# Patient Record
Sex: Male | Born: 1985 | Race: White | Hispanic: No | Marital: Married | Smoking: Former smoker
Health system: Southern US, Community
[De-identification: ages and names within clinical notes are randomized; demographics above are authoritative.]

## PROBLEM LIST (undated history)

## (undated) DIAGNOSIS — N62 Hypertrophy of breast: Secondary | ICD-10-CM

## (undated) DIAGNOSIS — R413 Other amnesia: Secondary | ICD-10-CM

## (undated) DIAGNOSIS — M549 Dorsalgia, unspecified: Secondary | ICD-10-CM

## (undated) DIAGNOSIS — M546 Pain in thoracic spine: Secondary | ICD-10-CM

## (undated) DIAGNOSIS — E0590 Thyrotoxicosis, unspecified without thyrotoxic crisis or storm: Secondary | ICD-10-CM

## (undated) HISTORY — DX: Hypertrophy of breast: N62

## (undated) HISTORY — DX: Other amnesia: R41.3

## (undated) HISTORY — PX: APPENDECTOMY: SHX54

## (undated) HISTORY — PX: TONSILLECTOMY AND ADENOIDECTOMY: SUR1326

## (undated) HISTORY — DX: Pain in thoracic spine: M54.6

## (undated) HISTORY — DX: Dorsalgia, unspecified: M54.9

## (undated) HISTORY — DX: Thyrotoxicosis, unspecified without thyrotoxic crisis or storm: E05.90

---

## 2013-10-06 DIAGNOSIS — N62 Hypertrophy of breast: Secondary | ICD-10-CM | POA: Insufficient documentation

## 2013-10-06 DIAGNOSIS — E0590 Thyrotoxicosis, unspecified without thyrotoxic crisis or storm: Secondary | ICD-10-CM | POA: Insufficient documentation

## 2013-10-06 HISTORY — DX: Hypertrophy of breast: N62

## 2013-10-06 HISTORY — DX: Thyrotoxicosis, unspecified without thyrotoxic crisis or storm: E05.90

## 2013-10-20 DIAGNOSIS — M546 Pain in thoracic spine: Secondary | ICD-10-CM | POA: Insufficient documentation

## 2013-10-20 DIAGNOSIS — M549 Dorsalgia, unspecified: Secondary | ICD-10-CM

## 2013-10-20 HISTORY — DX: Dorsalgia, unspecified: M54.9

## 2013-10-20 HISTORY — DX: Pain in thoracic spine: M54.6

## 2013-10-27 ENCOUNTER — Ambulatory Visit: Payer: Self-pay | Admitting: Unknown Physician Specialty

## 2014-01-19 DIAGNOSIS — R413 Other amnesia: Secondary | ICD-10-CM | POA: Insufficient documentation

## 2014-01-19 HISTORY — DX: Other amnesia: R41.3

## 2015-02-12 ENCOUNTER — Encounter: Payer: Self-pay | Admitting: *Deleted

## 2015-02-13 ENCOUNTER — Encounter: Payer: Self-pay | Admitting: Obstetrics and Gynecology

## 2015-02-13 ENCOUNTER — Ambulatory Visit (INDEPENDENT_AMBULATORY_CARE_PROVIDER_SITE_OTHER): Payer: BLUE CROSS/BLUE SHIELD | Admitting: Obstetrics and Gynecology

## 2015-02-13 VITALS — BP 107/63 | HR 58 | Resp 16 | Ht 73.5 in | Wt 209.0 lb

## 2015-02-13 DIAGNOSIS — Z309 Encounter for contraceptive management, unspecified: Secondary | ICD-10-CM

## 2015-02-13 DIAGNOSIS — Z3009 Encounter for other general counseling and advice on contraception: Secondary | ICD-10-CM

## 2015-02-13 MED ORDER — DIAZEPAM 10 MG PO TABS: ORAL_TABLET | ORAL | Status: AC

## 2015-02-13 NOTE — Progress Notes (Signed)
02/13/2015 10:31 AM   Tyler Dunn 1986/01/19 [id number]  Referring provider: No referring provider defined for this encounter.  Chief Complaint  Patient presents with  . Vasectomy consult  . Establish Care    HPI: Patient is a 29 year old male presenting today for vasectomy consultation.  He is married with 2 biological children. Wife is currently pregnant with 3rd child. He and his wife are in agreement that they desire no further children.  No previous scrotal injuries, infections or surgeries.  He denies any coagulation or bleeding disorders.    PMH: Past Medical History  Diagnosis Date  . Hyperthyroidism 10/06/2013  . Acute thoracic back pain 10/20/2013  . Back ache 10/20/2013  . Breast development in males 10/06/2013  . Episodic memory loss 01/19/2014    Surgical History: Past Surgical History  Procedure Laterality Date  . Tonsillectomy and adenoidectomy    . Appendectomy      Home Medications:    Medication List       This list is accurate as of: 02/13/15 10:31 AM.  Always use your most recent med list.               diazepam 10 MG tablet  Commonly known as:  VALIUM  Take 1 tablet 45mins prior to procedure     methimazole 10 MG tablet  Commonly known as:  TAPAZOLE  TAKE 1 TABLET BY MOUTH EACH DAY        Allergies:  Allergies  Allergen Reactions  . Codeine     Other reaction(s): Unknown  . Penicillins     Other reaction(s): Unknown    Family History: Family History  Problem Relation Age of Onset  . Kidney disease Neg Hx   . Kidney cancer Neg Hx   . Prostate cancer Neg Hx     Social History:  reports that he has quit smoking. His smoking use included Cigarettes. His smokeless tobacco use includes Snuff. He reports that he drinks alcohol. He reports that he does not use illicit drugs.  ROS: UROLOGY Frequent Urination?: No Hard to postpone urination?: No Burning/pain with urination?: No Get up at night to urinate?: No Leakage of urine?:  No Urine stream starts and stops?: No Trouble starting stream?: No Do you have to strain to urinate?: No Blood in urine?: No Urinary tract infection?: No Sexually transmitted disease?: No Injury to kidneys or bladder?: No Painful intercourse?: No Weak stream?: No Erection problems?: No Penile pain?: No  Gastrointestinal Nausea?: No Vomiting?: No Indigestion/heartburn?: No Diarrhea?: No Constipation?: No  Constitutional Fever: No Night sweats?: No Weight loss?: No Fatigue?: No  Skin Skin rash/lesions?: No Itching?: No  Eyes Blurred vision?: No Double vision?: No  Ears/Nose/Throat Sore throat?: No Sinus problems?: No  Hematologic/Lymphatic Swollen glands?: No Easy bruising?: No  Cardiovascular Leg swelling?: No Chest pain?: No  Respiratory Cough?: No Shortness of breath?: No  Endocrine Excessive thirst?: No  Musculoskeletal Back pain?: No Joint pain?: No  Neurological Headaches?: No Dizziness?: No  Psychologic Depression?: No Anxiety?: No  Physical Exam: BP 107/63 mmHg  Pulse 58  Resp 16  Ht 6' 1.5" (1.867 m)  Wt 209 lb (94.802 kg)  BMI 27.20 kg/m2  Constitutional:  Alert and oriented, No acute distress. HEENT: [place] AT, moist mucus membranes.  Trachea midline, no masses. Cardiovascular: No clubbing, cyanosis, or edema. Respiratory: Normal respiratory effort, no increased work of breathing. GU: normal circumcised phallus, testicles descended bilaterally, normal epididymis, vas deferens normal and easily palpable bilaterally, no palpable masses  Skin: No rashes, bruises or suspicious lesions. Lymph: No cervical or inguinal adenopathy. Neurologic: Grossly intact, no focal deficits, moving all 4 extremities. Psychiatric: Normal mood and affect.  Assessment & Plan:    1. Vasectomy Consult-  Today, we discussed what the vas deferens is, where it is located, and its function. We reviewed the procedure for vasectomy, it's risks, benefits,  alternatives, and likelihood of achieving his goals. We discussed in detail the procedure, complications, and recovery as well as the need for clearance prior to unprotected intercourse. We discussed that vasectomy does not protect against sexually transmitted diseases. We discussed that this procedure does not result in immediate sterility and that they would need to use other forms of birth control until he has been cleared with negative postvasectomy semen analyses. I explained that the procedure is considered to be permanent and that attempts at reversal have varying degrees of success. These options include vasectomy reversal, sperm retrieval, and in vitro fertilization; these can be very expensive. We discussed the chance of postvasectomy pain syndrome which occurs in less than 5% of patients. I explained to the patient that there is no treatment to resolve this chronic pain, and that if it developed I would not be able to help resolve the issue, but that surgery is generally not needed for correction. I explained there have even been reports of systemic like illness associated with this chronic pain, and that there was no good cure. I explained that vasectomy it is not a 100% reliable form of birth control, and the risk of pregnancy after vasectomy is approximately 1 in 2000 men who had a negative postvasectomy semen analysis or rare non-motile sperm. I explained that repeat vasectomy was necessary in less than 1% of vasectomy procedures when employing the type of technique that I use. I explained that he should refrain from ejaculation for approximately one week following vasectomy. I explained that there are other options for birth control which are permanent and non-permanent; we discussed these. I explained the rates of surgical complications, such as symptomatic hematoma or infection, are low (1-2%) and vary with the surgeon's experience and criteria used to diagnose the complication.  The patient had  the opportunity to ask questions to his stated satisfaction. He voiced understanding of the above factors and stated that he has read all the information provided to him and the packets and informed consent  Patient's questions have been answered and he was given the pre-op vasectomy instruction sheet.  He is prescribed Valium 10 mg and instructed to take it 30 minutes prior to his vasectomy appointment.  He is to have a driver.  I reemphasized to the patient that this is to be considered a permanent form of birth control, that he is to use an alternative form of birth control until we receive the 3 months specimen and it is cleared of sperm and that this will not prevent STI's.  His questions are answered to his satisfaction and he understands the risks and is willing to proceed with the vasectomy.  He will schedule his vasectomy.    I spent 30 min with this patient of which greater than 50% was spent in counseling and coordination of care with the patient.   There are no diagnoses linked to this encounter.  Return for schedule vasectomy.  These notes generated with voice recognition software. I apologize for typographical errors.  Earlie Lou, FNP  [facility] [address], [address] [place], [place] [address] [phone]

## 2015-09-28 ENCOUNTER — Other Ambulatory Visit: Payer: Self-pay | Admitting: Family Medicine

## 2015-09-28 DIAGNOSIS — R1012 Left upper quadrant pain: Secondary | ICD-10-CM

## 2015-10-02 ENCOUNTER — Ambulatory Visit
Admission: RE | Admit: 2015-10-02 | Discharge: 2015-10-02 | Disposition: A | Discharge status: Home / Self Care | Payer: BLUE CROSS/BLUE SHIELD | Source: Ambulatory Visit | Attending: Family Medicine | Admitting: Family Medicine

## 2015-10-02 DIAGNOSIS — R1012 Left upper quadrant pain: Secondary | ICD-10-CM | POA: Diagnosis not present

## 2019-10-04 ENCOUNTER — Other Ambulatory Visit: Payer: Self-pay | Admitting: Physician Assistant

## 2019-10-04 DIAGNOSIS — IMO0001 Reserved for inherently not codable concepts without codable children: Secondary | ICD-10-CM

## 2019-10-17 ENCOUNTER — Other Ambulatory Visit: Payer: Self-pay

## 2019-10-17 ENCOUNTER — Ambulatory Visit
Admission: RE | Admit: 2019-10-17 | Discharge: 2019-10-17 | Disposition: A | Discharge status: Home / Self Care | Payer: 59 | Source: Ambulatory Visit | Attending: Physician Assistant | Admitting: Physician Assistant

## 2019-10-17 DIAGNOSIS — H9042 Sensorineural hearing loss, unilateral, left ear, with unrestricted hearing on the contralateral side: Secondary | ICD-10-CM | POA: Diagnosis present

## 2019-10-17 DIAGNOSIS — IMO0001 Reserved for inherently not codable concepts without codable children: Secondary | ICD-10-CM

## 2019-10-17 MED ORDER — GADOBUTROL 1 MMOL/ML IV SOLN
9.0000 mL | Freq: Once | INTRAVENOUS | Status: AC | PRN
  Administered 2019-10-17: 9 mL via INTRAVENOUS

## 2019-11-13 ENCOUNTER — Emergency Department: Payer: 59

## 2019-11-13 ENCOUNTER — Emergency Department
Admission: EM | Admit: 2019-11-13 | Discharge: 2019-11-13 | Disposition: A | Discharge status: Home / Self Care | Payer: 59 | Attending: Emergency Medicine | Admitting: Emergency Medicine

## 2019-11-13 DIAGNOSIS — F17228 Nicotine dependence, chewing tobacco, with other nicotine-induced disorders: Secondary | ICD-10-CM | POA: Diagnosis not present

## 2019-11-13 DIAGNOSIS — M542 Cervicalgia: Secondary | ICD-10-CM | POA: Diagnosis present

## 2019-11-13 DIAGNOSIS — Z880 Allergy status to penicillin: Secondary | ICD-10-CM | POA: Insufficient documentation

## 2019-11-13 DIAGNOSIS — M436 Torticollis: Secondary | ICD-10-CM | POA: Insufficient documentation

## 2019-11-13 MED ORDER — HYDROCODONE-ACETAMINOPHEN 5-325 MG PO TABS: 1.0000 | ORAL_TABLET | Freq: Four times a day (QID) | ORAL | 0 refills | Status: AC | PRN

## 2019-11-13 MED ORDER — METHOCARBAMOL 500 MG PO TABS: ORAL_TABLET | ORAL | 0 refills | Status: AC

## 2019-11-13 MED ORDER — NAPROXEN 500 MG PO TABS
500.0000 mg | ORAL_TABLET | Freq: Two times a day (BID) | ORAL | 0 refills | Status: AC
Start: 2019-11-13 — End: ?

## 2019-11-13 MED ORDER — KETOROLAC TROMETHAMINE 30 MG/ML IJ SOLN
30.0000 mg | Freq: Once | INTRAMUSCULAR | Status: DC
  Filled 2019-11-13: qty 1

## 2019-11-13 NOTE — Discharge Instructions (Addendum)
Follow-up with your primary care provider if any continued problems or not improving.  Begin taking medication that was sent to your pharmacy.  The hydrocodone is a narcotic and you have taken it in the past.  The methocarbamol is a muscle relaxant you may take 1 or 2 every 6 hours as needed for muscle spasms.  The naproxen is 500 mg twice a day with food.  Do not take the hydrocodone and methocarbamol if you plan on driving as it could cause drowsiness and increase your risk for injury.  Ice or heat to your muscles as needed for discomfort.  Try one and then try the other to see which one helps your muscles the best.  You should not plan on working if you are needing to take the muscle relaxant and pain medication.

## 2019-11-13 NOTE — ED Triage Notes (Signed)
Patient states he was asleep and felt a pop in his neck. His ROM of motion is now limited and to look down causes pain to go down into his back. Patient denies injury, and states this has not happened before.

## 2019-11-13 NOTE — ED Provider Notes (Signed)
[facility] Emergency Department Provider Note   ____________________________________________   None    (approximate)  I have reviewed the triage vital signs and the nursing notes.   HISTORY  Chief Complaint Neck Pain    HPI Tyler Dunn is a 34 y.o. male presents to the ED with complaint of neck pain.  Patient states that he was asleep and felt a pop in his neck that has limited his range of motion.  There was no accident or injury yesterday.  Patient states that [place].  He states this has never happened before.  He denies any muscle weakness or difficulty walking.  Currently he rates his pain as a 3 out of 10.     Past Medical History:  Diagnosis Date   Acute thoracic back pain 10/20/2013   Back ache 10/20/2013   Breast development in males 10/06/2013   Episodic memory loss 01/19/2014   Hyperthyroidism 10/06/2013    Patient Active Problem List   Diagnosis Date Noted   Episodic memory loss 01/19/2014   Acute thoracic back pain 10/20/2013   Back ache 10/20/2013   Breast development in males 10/06/2013   Hyperthyroidism 10/06/2013    Past Surgical History:  Procedure Laterality Date   APPENDECTOMY     TONSILLECTOMY AND ADENOIDECTOMY      Prior to Admission medications   Medication Sig Start Date End Date Taking? Authorizing Provider  diazepam (VALIUM) 10 MG tablet Take 1 tablet prior to procedure 02/13/15   Fernanda Drum, FNP  HYDROcodone-acetaminophen (NORCO/VICODIN) 5-325 MG tablet Take 1 tablet by mouth every 6 (six) hours as needed for moderate pain. 11/13/19   Tommi Rumps, PA-C  methimazole (TAPAZOLE) 10 MG tablet TAKE 1 TABLET BY MOUTH EACH DAY 02/03/15   [provider]  methocarbamol (ROBAXIN) 500 MG tablet 1-2 tablets every 6 hours prn muscle spasms 11/13/19   Tommi Rumps, PA-C  naproxen (NAPROSYN) 500 MG tablet Take 1 tablet (500 mg total) by mouth 2 (two) times  daily with a meal. 11/13/19   Tommi Rumps, PA-C    Allergies Codeine and Penicillins  Family History  Problem Relation Age of Onset   Kidney disease Neg Hx    Kidney cancer Neg Hx    Prostate cancer Neg Hx     Social History Social History   Tobacco Use   Smoking status: Former Smoker    Types: Cigarettes   Smokeless tobacco: Current User    Types: Snuff  Substance Use Topics   Alcohol use: Yes    Alcohol/week: 0.0 standard drinks   Drug use: No    Review of Systems Constitutional: No fever/chills. Eyes: No visual changes. ENT: No sore throat. Cardiovascular: Denies chest pain. Respiratory: Denies shortness of breath.  Negative for cough. Gastrointestinal: No abdominal pain.  No nausea, no vomiting.  Negative for diarrhea. Musculoskeletal: Negative for back pain.  Positive for cervical pain. Skin: Negative for rash. Neurological: Negative for headaches, focal weakness or numbness. ____________________________________________   PHYSICAL EXAM:  VITAL SIGNS: ED Triage Vitals  Enc Vitals Group     BP 11/13/19 0526 123/79     Pulse Rate 11/13/19 0526 62     Resp 11/13/19 0526 18     Temp 11/13/19 0526 98.2 F (36.8 C)     Temp Source 11/13/19 0526 Oral     SpO2 11/13/19 0526 100 %     Weight 11/13/19 0528 (!) 220 lb (99.8  kg)     Height 11/13/19 0528 6\' 1"  (1.854 m)     Head Circumference --      Peak Flow --      Pain Score 11/13/19 0527 3     Pain Loc --      Pain Edu? --      Excl. in GC? --    Constitutional: Alert and oriented. Well appearing and in no acute distress. Eyes: Conjunctivae are normal. PERRL. EOMI. Head: Atraumatic. Nose: No congestion/rhinnorhea. Neck: No stridor.  No point tenderness on palpation of cervical spine posteriorly.  Range of motion is restricted with lateral movement with the left being more restricted than the right.  Patient is able to extend but is limited with flexion.  No soft tissue injury or abrasions are  noted. Cardiovascular: Normal rate, regular rhythm. Grossly normal heart sounds.  Good peripheral circulation. Respiratory: Normal respiratory effort.  No retractions. Lungs CTAB. Gastrointestinal: Soft and nontender. No distention. Musculoskeletal: Moves upper and lower extremities without any difficulty.  Patient able to stand from a sitting position without any assistance.  Normal gait was noted.  Patient is able to form a complete fist bilaterally without any difficulty.  No isolated tenderness is noted to the trapezius or rhomboid muscles. Neurologic:  Normal speech and language. No gross focal neurologic deficits are appreciated. No gait instability. Skin:  Skin is warm, dry and intact. No rash noted. Psychiatric: Mood and affect are normal. Speech and behavior are normal.  ____________________________________________   LABS (all labs ordered are listed, but only abnormal results are displayed)  Labs Reviewed - No data to display  RADIOLOGY   Official radiology report(s): DG Cervical Spine Complete  Result Date: 11/13/2019 CLINICAL DATA:  Felt pop in neck.  Now with pain. EXAM: CERVICAL SPINE - COMPLETE 4+ VIEW COMPARISON:  None. FINDINGS: There is no evidence of cervical spine fracture or prevertebral soft tissue swelling. Alignment is normal. No other significant bone abnormalities are identified. IMPRESSION: Negative cervical spine radiographs. Electronically Signed   By: 11/15/2019 M.D.   On: 11/13/2019 06:17    ____________________________________________   PROCEDURES  Procedure(s) performed (including Critical Care):  Procedures   ____________________________________________   INITIAL IMPRESSION / ASSESSMENT AND PLAN / ED COURSE  As part of my medical decision making, I reviewed the following data within the electronic MEDICAL RECORD NUMBER Notes from prior ED visits and [place] Controlled Substance Database  Tyler Dunn was evaluated in Emergency Department on  11/13/2019 for the symptoms described in the history of present illness. He was evaluated in the context of the global COVID-19 pandemic, which necessitated consideration that the patient might be at risk for infection with the SARS-CoV-2 virus that causes COVID-19. Institutional protocols and algorithms that pertain to the evaluation of patients at risk for COVID-19 are in a state of rapid change based on information released by regulatory bodies including the CDC and federal and state organizations. These policies and algorithms were followed during the patient's care in the ED.  34 year old male presents to the ED with sudden onset of decreased range of motion and a pop to his neck while sleeping last night.  Patient denies any injury in the last several days that could cause this.  Range of motion laterally and also with flexion increases his cervical pain.  Remaining physical was benign.  Cervical x-rays were negative.  Physical exam and history are consistent with an acute torticollis.  Patient drove himself to the ED and although he  was reassured that the Toradol injection would not cause any drowsiness he refused stating that he would take the prescription medication when he got home.  He is encouraged to ice or apply heat to his muscles and begin medication.  A prescription for methocarbamol, naproxen and hydrocodone was sent to his pharmacy.  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  He is aware that the 2 medications could cause drowsiness and increase his risk for falling.  ____________________________________________   FINAL CLINICAL IMPRESSION(S) / ED DIAGNOSES  Final diagnoses:  Torticollis, acute     ED Discharge Orders         Ordered    methocarbamol (ROBAXIN) 500 MG tablet     Discontinue  Reprint     11/13/19 0745    naproxen (NAPROSYN) 500 MG tablet  2 times daily with meals     Discontinue  Reprint     11/13/19 0745     HYDROcodone-acetaminophen (NORCO/VICODIN) 5-325 MG tablet  Every 6 hours PRN     Discontinue  Reprint     11/13/19 0745           Note:  This document was prepared using Dragon voice recognition software and may include unintentional dictation errors.    Tommi Rumps, PA-C 11/13/19 [id number]    Jene Every, MD 11/13/19 [id number]

## 2020-06-01 ENCOUNTER — Other Ambulatory Visit: Payer: Self-pay | Admitting: Family Medicine

## 2020-06-11 ENCOUNTER — Other Ambulatory Visit: Payer: Self-pay | Admitting: Family Medicine

## 2020-06-11 DIAGNOSIS — U099 Post covid-19 condition, unspecified: Secondary | ICD-10-CM

## 2020-06-18 ENCOUNTER — Ambulatory Visit
Admission: RE | Admit: 2020-06-18 | Discharge: 2020-06-18 | Disposition: A | Discharge status: Home / Self Care | Payer: 59 | Source: Ambulatory Visit | Attending: Family Medicine | Admitting: Family Medicine

## 2020-06-18 ENCOUNTER — Other Ambulatory Visit: Payer: Self-pay

## 2020-06-18 DIAGNOSIS — U099 Post covid-19 condition, unspecified: Secondary | ICD-10-CM | POA: Diagnosis present

## 2020-06-18 MED ORDER — IOHEXOL 300 MG/ML  SOLN
75.0000 mL | Freq: Once | INTRAMUSCULAR | Status: AC | PRN
  Administered 2020-06-18: 75 mL via INTRAVENOUS

## 2020-06-21 ENCOUNTER — Other Ambulatory Visit: Payer: Self-pay | Admitting: Family Medicine

## 2020-06-21 ENCOUNTER — Other Ambulatory Visit (HOSPITAL_COMMUNITY): Payer: Self-pay | Admitting: Family Medicine

## 2020-06-21 DIAGNOSIS — R1011 Right upper quadrant pain: Secondary | ICD-10-CM

## 2020-07-02 ENCOUNTER — Other Ambulatory Visit: Payer: Self-pay

## 2020-07-02 ENCOUNTER — Ambulatory Visit
Admission: RE | Admit: 2020-07-02 | Discharge: 2020-07-02 | Disposition: A | Discharge status: Home / Self Care | Payer: 59 | Source: Ambulatory Visit | Attending: Family Medicine | Admitting: Family Medicine

## 2020-07-02 DIAGNOSIS — R1011 Right upper quadrant pain: Secondary | ICD-10-CM | POA: Diagnosis not present

## 2021-09-12 ENCOUNTER — Other Ambulatory Visit: Payer: Self-pay | Admitting: Family Medicine

## 2021-09-12 DIAGNOSIS — M542 Cervicalgia: Secondary | ICD-10-CM

## 2021-09-17 ENCOUNTER — Ambulatory Visit
Admission: RE | Admit: 2021-09-17 | Discharge: 2021-09-17 | Disposition: A | Discharge status: Home / Self Care | Payer: 59 | Source: Ambulatory Visit | Attending: Family Medicine | Admitting: Family Medicine

## 2021-09-17 DIAGNOSIS — M542 Cervicalgia: Secondary | ICD-10-CM

## 2021-11-25 ENCOUNTER — Other Ambulatory Visit: Payer: Self-pay | Admitting: Student

## 2021-11-25 DIAGNOSIS — M542 Cervicalgia: Secondary | ICD-10-CM

## 2021-12-12 ENCOUNTER — Ambulatory Visit
Admission: RE | Admit: 2021-12-12 | Discharge: 2021-12-12 | Disposition: A | Discharge status: Home / Self Care | Payer: 59 | Source: Ambulatory Visit | Attending: Student | Admitting: Student

## 2021-12-12 DIAGNOSIS — M542 Cervicalgia: Secondary | ICD-10-CM

## 2021-12-12 MED ORDER — IOPAMIDOL (ISOVUE-300) INJECTION 61%
75.0000 mL | Freq: Once | INTRAVENOUS | Status: AC | PRN
  Administered 2021-12-12: 75 mL via INTRAVENOUS

## 2023-01-04 IMAGING — US US ABDOMEN LIMITED RUQ/ASCITES
1 series · 15 of 25 positions shown · non-contrast
Comparison: None.

CLINICAL DATA: Abdominal pain.

EXAM:
ULTRASOUND ABDOMEN LIMITED RIGHT UPPER QUADRANT

[Series 1: us abdomen limited ruq · 15 of 44 slices shown]
[im 1/44]
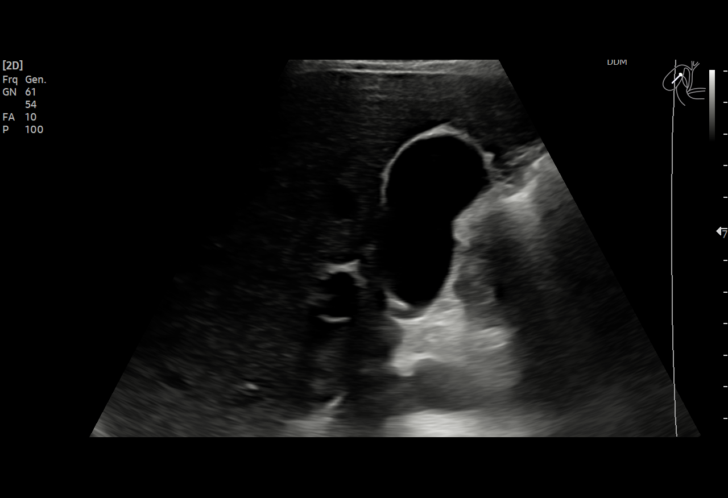
[im 4/44]
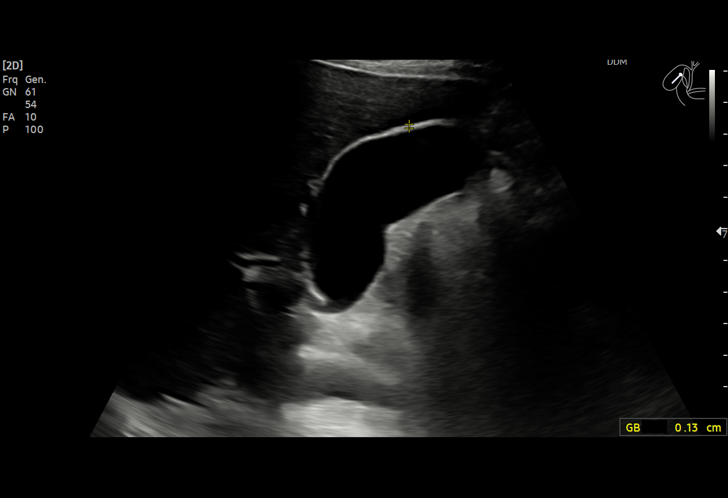
[im 8/44]
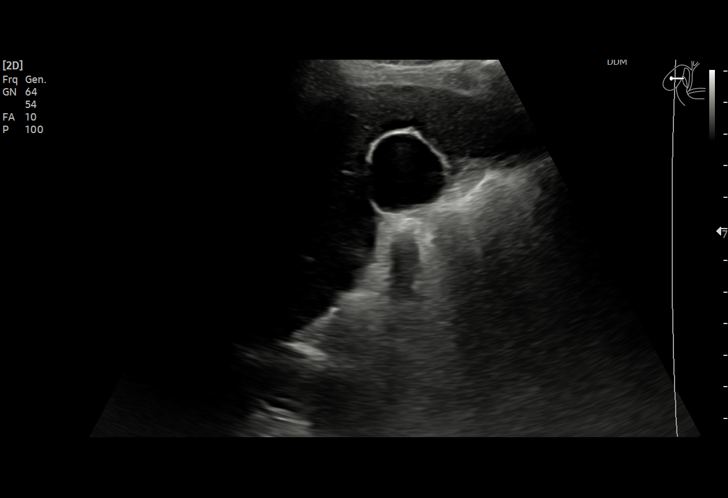
[im 9/44]
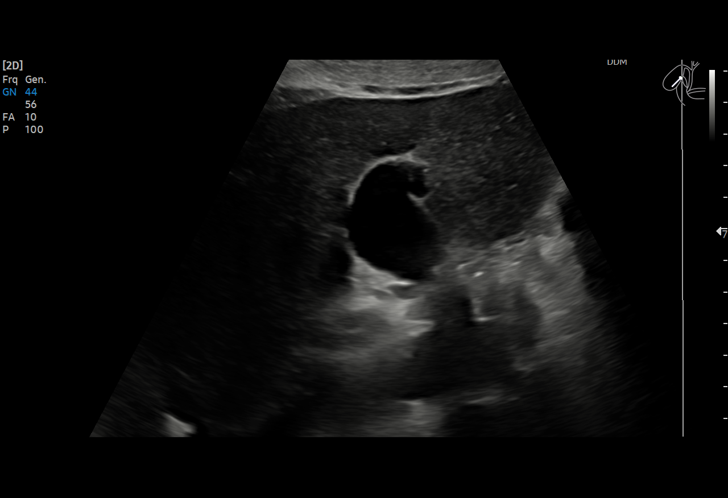
[im 13/44]
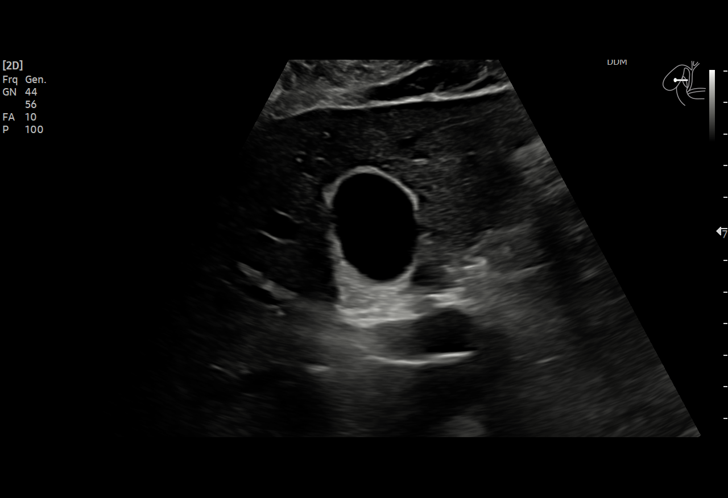
[im 17/44]
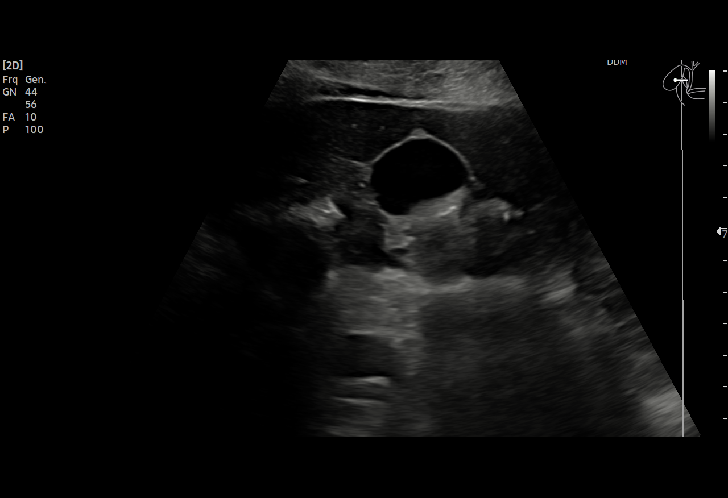
[im 18/44]
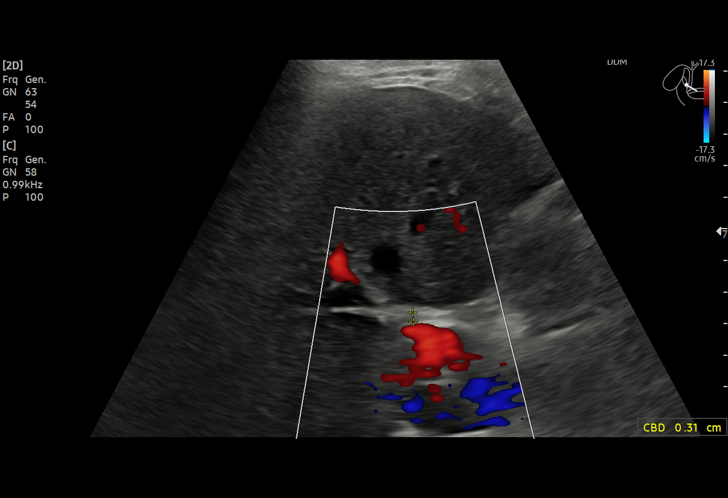
[im 22/44]
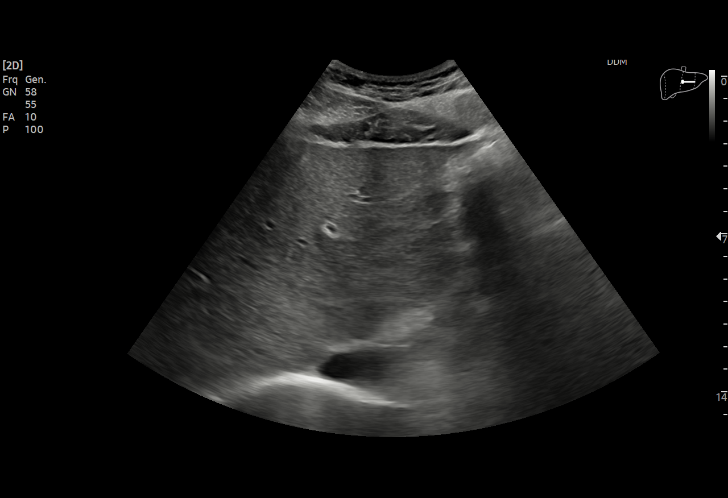
[im 26/44]
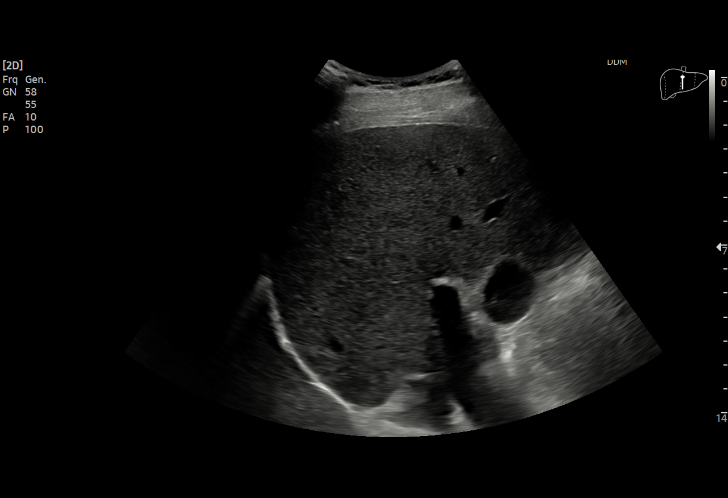
[im 27/44]
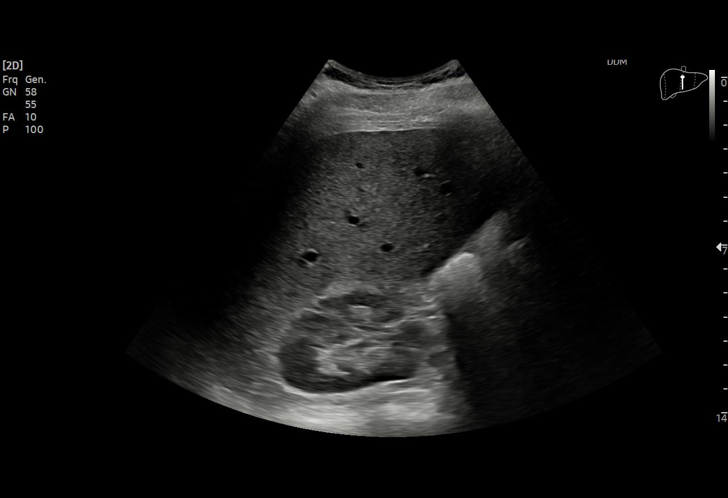
[im 31/44]
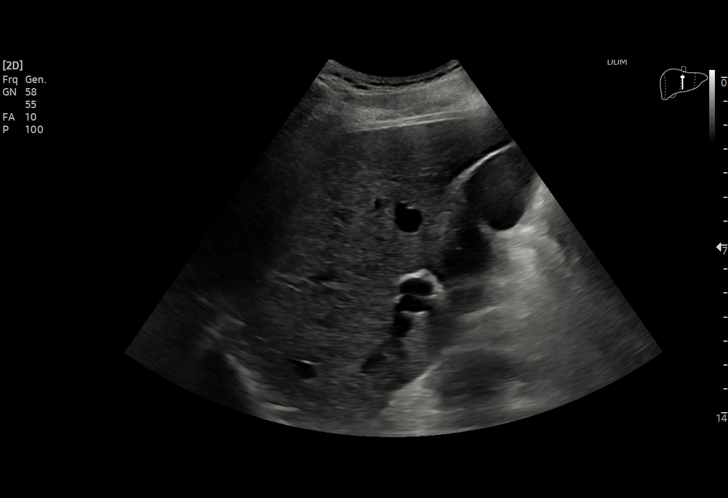
[im 35/44]
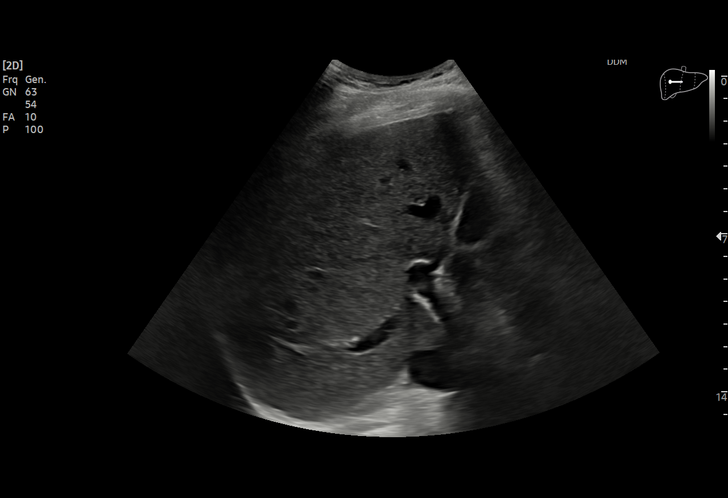
[im 36/44]
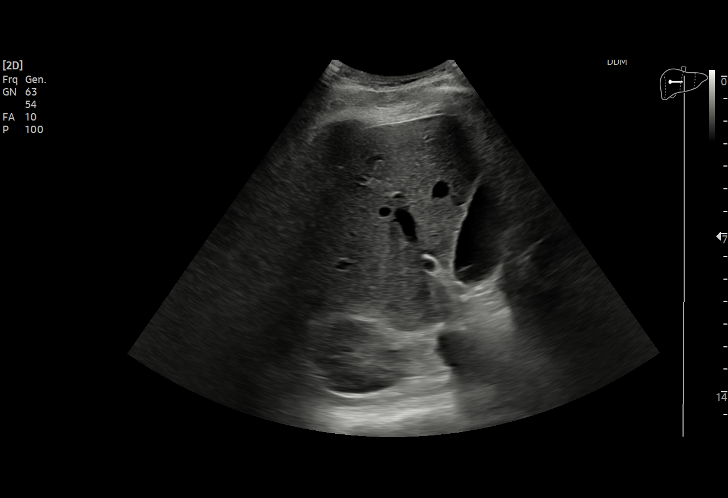
[im 40/44]
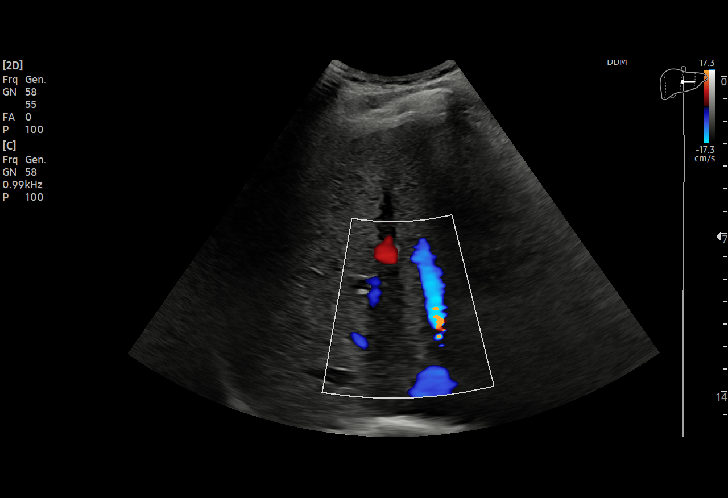
[im 44/44]
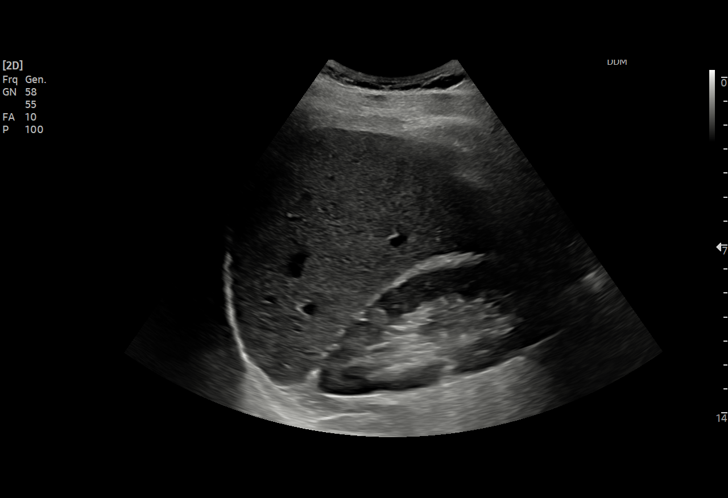

[15 of 25 positions shown; findings below may reference images not displayed]

FINDINGS: Gallbladder:

No gallstones or wall thickening visualized. No sonographic Murphy
sign noted by sonographer.

Common bile duct:

Diameter: 3 mm

Liver:

No focal lesion identified. Within normal limits in parenchymal
echogenicity. Portal vein is patent on color Doppler imaging with
normal direction of blood flow towards the liver.

Other: None.
IMPRESSION: Unremarkable examination

## 2023-01-12 ENCOUNTER — Other Ambulatory Visit: Payer: Self-pay | Admitting: Physical Medicine & Rehabilitation

## 2023-01-12 DIAGNOSIS — M542 Cervicalgia: Secondary | ICD-10-CM

## 2023-03-28 ENCOUNTER — Ambulatory Visit
Admission: RE | Admit: 2023-03-28 | Discharge: 2023-03-28 | Disposition: A | Discharge status: Home / Self Care | Payer: BC Managed Care – PPO | Source: Ambulatory Visit | Attending: Physical Medicine & Rehabilitation | Admitting: Physical Medicine & Rehabilitation

## 2023-03-28 DIAGNOSIS — M542 Cervicalgia: Secondary | ICD-10-CM

## 2024-03-21 IMAGING — US US THYROID
1 series · 13 of 25 positions shown · non-contrast
Comparison: None Available.

CLINICAL DATA: 36-year-old male with history of right-sided neck
pain and hyperthyroidism.

EXAM:
THYROID ULTRASOUND
TECHNIQUE: Ultrasound examination of the thyroid gland and adjacent soft
tissues was performed.

[Series 1: us thyroid · 0.07mm/px · 13 of 94 slices shown]
[im 1/94]
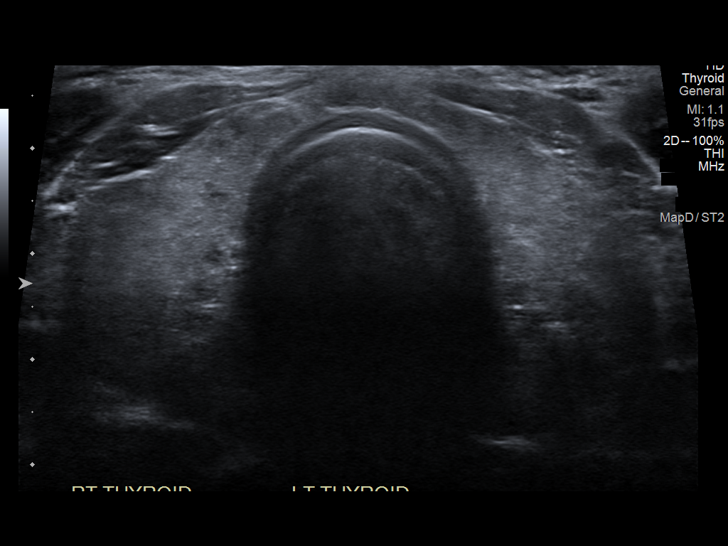
[im 8/94]
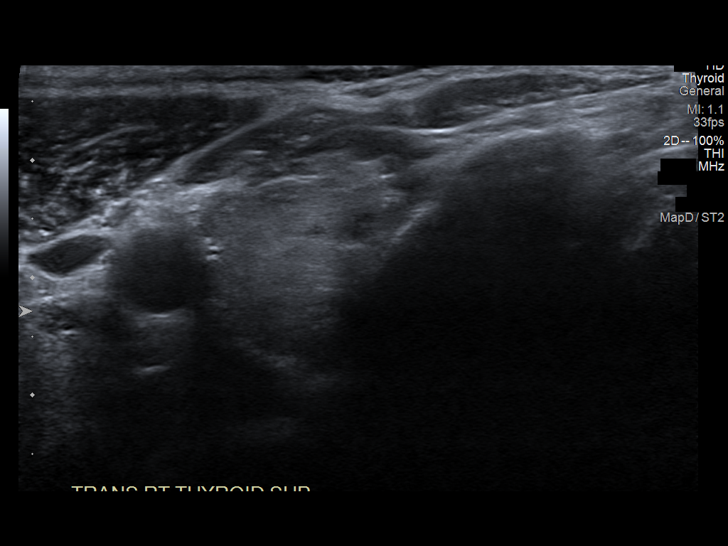
[im 16/94]
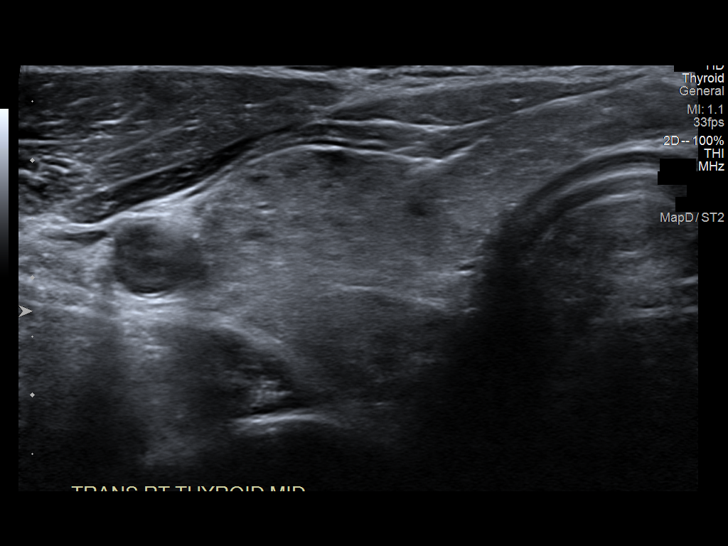
[im 24/94]
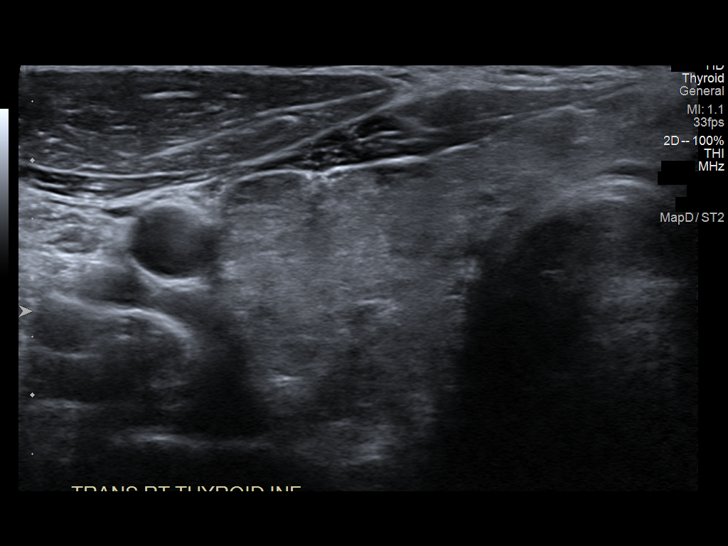
[im 32/94]
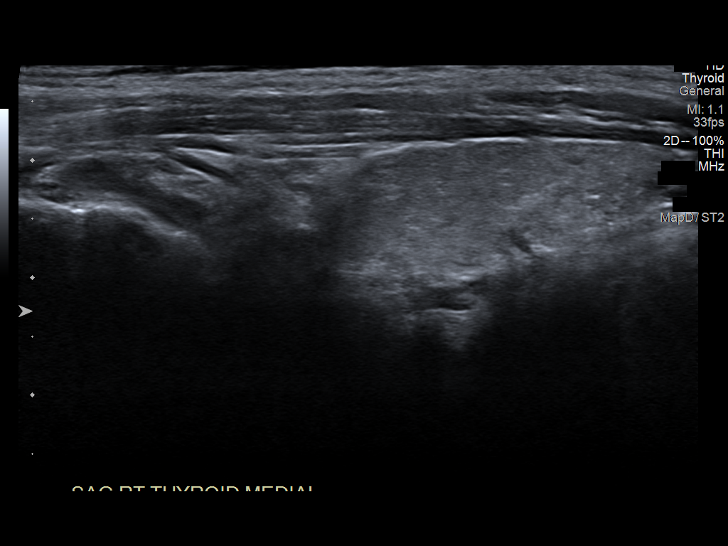
[im 39/94]
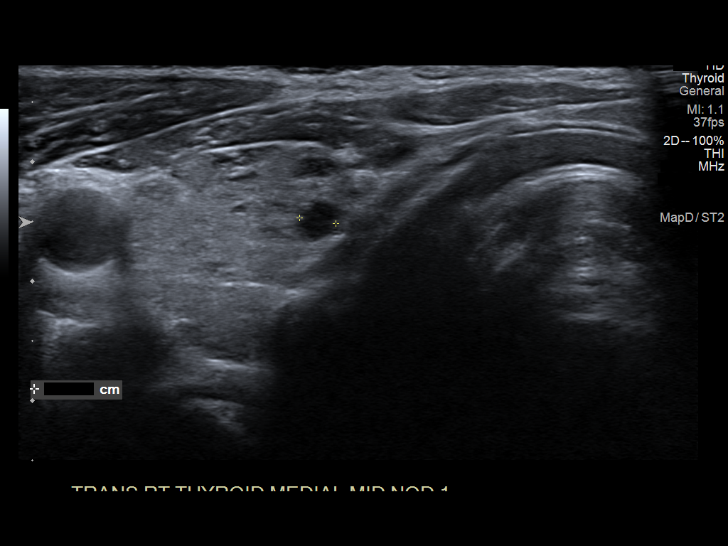
[im 47/94]
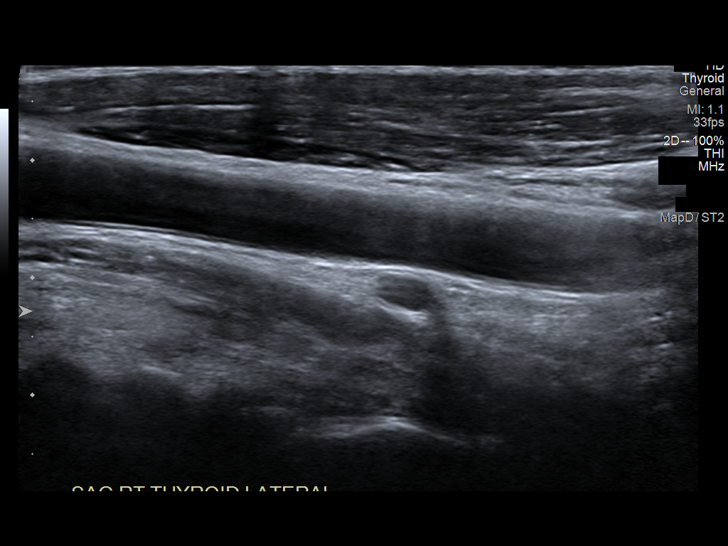
[im 55/94]
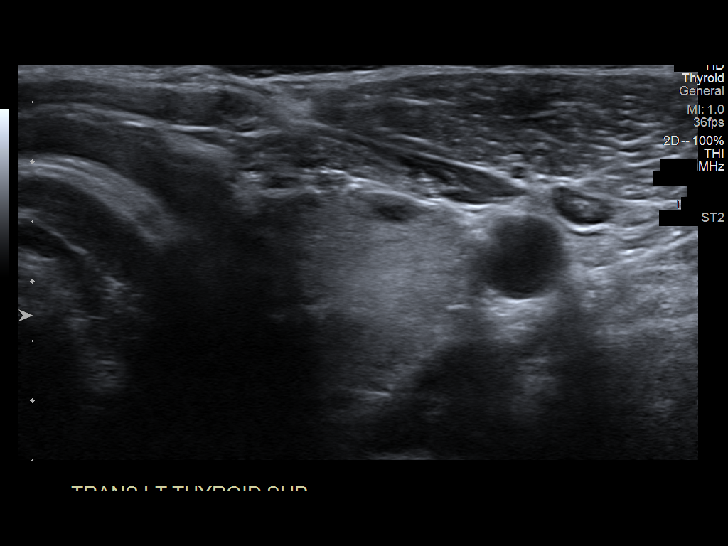
[im 63/94]
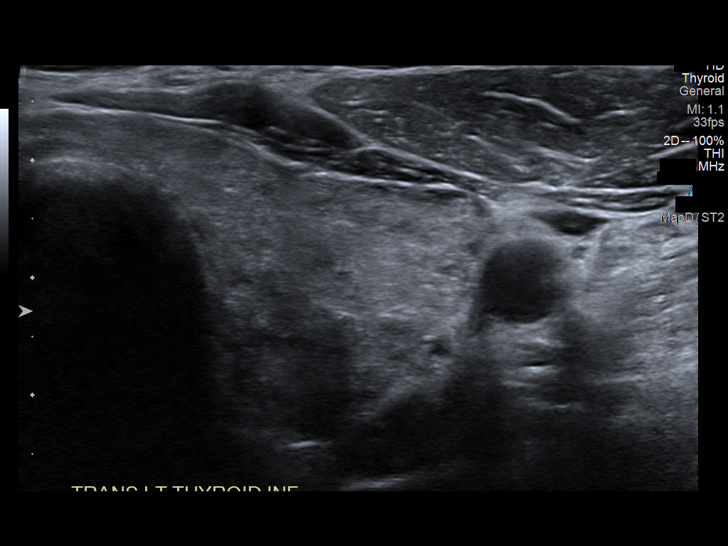
[im 70/94]
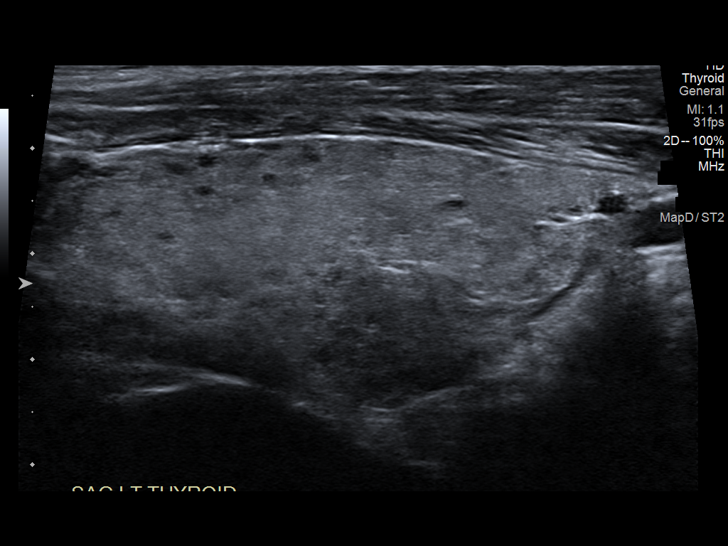
[im 78/94]
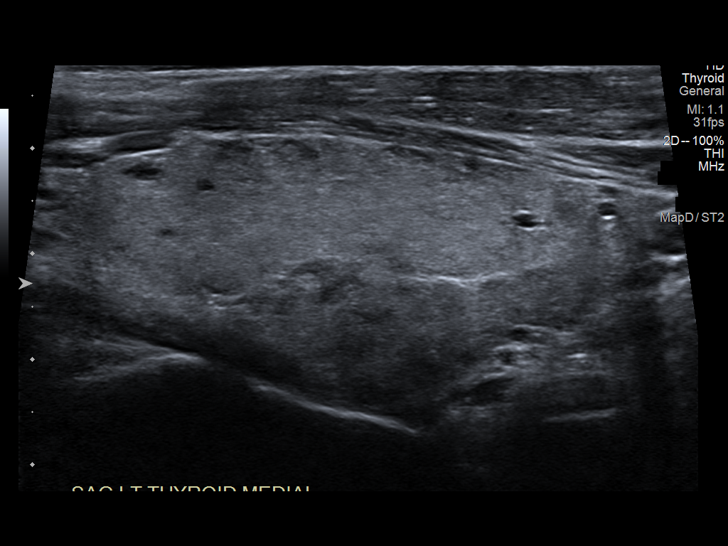
[im 86/94]
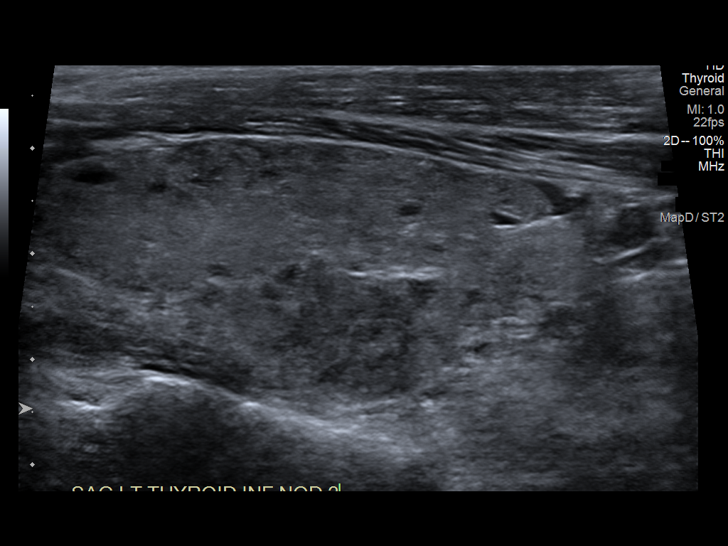
[im 94/94]
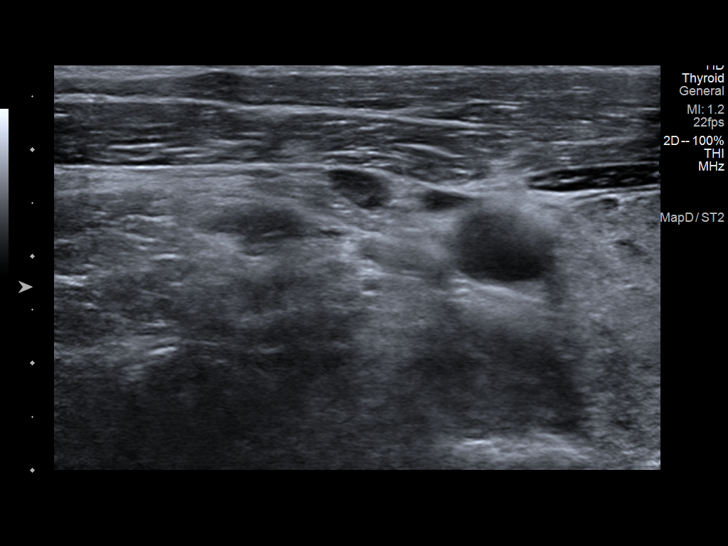

[13 of 25 positions shown; findings below may reference images not displayed]

FINDINGS: Parenchymal Echotexture: Mildly heterogenous, no significantly
increased vascularity

Isthmus: 0.4 cm

Right lobe: 5.8 x 2.3 x 2.5 cm

Left lobe: 5.6 x 2.3 x 2.4 cm

________________________________________________________

Estimated total number of nodules >/= 1 cm: 0

Number of spongiform nodules >/=  2 cm not described below (TR1): 0

Number of mixed cystic and solid nodules >/= 1.5 cm not described
below (TR2): 0

_________________________________________________________

Nodule # 2:

Location: Left; Inferior

Maximum size: 1.1 cm; Other 2 dimensions: 1.1 x 1.0 cm

Composition: solid/almost completely solid (2)

Echogenicity: hypoechoic (2)

Shape: not taller-than-wide (0)

Margins: ill-defined (0)

Echogenic foci: none (0)

ACR TI-RADS total points: 4.

ACR TI-RADS risk category: TR4 (4-6 points).

ACR TI-RADS recommendations:

*Given size (>/= 1 - 1.4 cm) and appearance, a follow-up ultrasound
in 1 year should be considered based on TI-RADS criteria.

_________________________________________________________

The painful area of concern in the right neck demonstrates no
sonographic abnormalities.

No cervical lymphadenopathy.
IMPRESSION: 1. Mildly enlarged, mildly heterogeneous thyroid parenchyma,
nonspecific findings.
2. Solid nodule in the left inferior thyroid (labeled 2, 1.1 cm)
meets criteria (TI-RADS category 4) for 1 year ultrasound
surveillance.
3. No sonographic abnormality in the right neck painful area of
concern.

The above is in keeping with the ACR TI-RADS recommendations - [HOSPITAL] 2740;[DATE].
